# Patient Record
Sex: Female | Born: 1960 | Race: White | Hispanic: No | Marital: Married
Health system: Southern US, Community
[De-identification: ages and names within clinical notes are randomized; demographics above are authoritative.]

---

## 1998-04-16 ENCOUNTER — Other Ambulatory Visit: Admission: RE | Admit: 1998-04-16 | Discharge: 1998-04-16 | Payer: Self-pay | Admitting: Gynecology

## 1999-06-10 ENCOUNTER — Other Ambulatory Visit: Admission: RE | Admit: 1999-06-10 | Discharge: 1999-06-10 | Payer: Self-pay | Admitting: Gynecology

## 2000-08-03 ENCOUNTER — Other Ambulatory Visit: Admission: RE | Admit: 2000-08-03 | Discharge: 2000-08-03 | Payer: Self-pay | Admitting: Gynecology

## 2001-05-12 ENCOUNTER — Encounter: Admission: RE | Admit: 2001-05-12 | Discharge: 2001-05-12 | Payer: Self-pay | Admitting: Gynecology

## 2001-05-12 ENCOUNTER — Encounter: Payer: Self-pay | Admitting: Gynecology

## 2001-09-16 ENCOUNTER — Other Ambulatory Visit: Admission: RE | Admit: 2001-09-16 | Discharge: 2001-09-16 | Payer: Self-pay | Admitting: Gynecology

## 2002-06-09 ENCOUNTER — Encounter: Payer: Self-pay | Admitting: Gynecology

## 2002-06-09 ENCOUNTER — Encounter: Admission: RE | Admit: 2002-06-09 | Discharge: 2002-06-09 | Payer: Self-pay | Admitting: Gynecology

## 2002-08-16 ENCOUNTER — Other Ambulatory Visit: Admission: RE | Admit: 2002-08-16 | Discharge: 2002-08-16 | Payer: Self-pay | Admitting: Gynecology

## 2003-07-31 ENCOUNTER — Encounter: Admission: RE | Admit: 2003-07-31 | Discharge: 2003-07-31 | Payer: Self-pay | Admitting: Gynecology

## 2003-08-24 ENCOUNTER — Other Ambulatory Visit: Admission: RE | Admit: 2003-08-24 | Discharge: 2003-08-24 | Payer: Self-pay | Admitting: Gynecology

## 2004-09-26 ENCOUNTER — Encounter: Admission: RE | Admit: 2004-09-26 | Discharge: 2004-09-26 | Payer: Self-pay | Admitting: Gynecology

## 2004-09-26 ENCOUNTER — Other Ambulatory Visit: Admission: RE | Admit: 2004-09-26 | Discharge: 2004-09-26 | Payer: Self-pay | Admitting: Gynecology

## 2004-11-01 ENCOUNTER — Other Ambulatory Visit: Admission: RE | Admit: 2004-11-01 | Discharge: 2004-11-01 | Payer: Self-pay | Admitting: Gynecology

## 2005-03-11 ENCOUNTER — Other Ambulatory Visit: Admission: RE | Admit: 2005-03-11 | Discharge: 2005-03-11 | Payer: Self-pay | Admitting: Gynecology

## 2005-10-16 ENCOUNTER — Encounter: Admission: RE | Admit: 2005-10-16 | Discharge: 2005-10-16 | Payer: Self-pay | Admitting: Gynecology

## 2005-10-16 ENCOUNTER — Other Ambulatory Visit: Admission: RE | Admit: 2005-10-16 | Discharge: 2005-10-16 | Payer: Self-pay | Admitting: Gynecology

## 2006-05-08 ENCOUNTER — Other Ambulatory Visit: Admission: RE | Admit: 2006-05-08 | Discharge: 2006-05-08 | Payer: Self-pay | Admitting: Gynecology

## 2006-12-18 ENCOUNTER — Encounter: Admission: RE | Admit: 2006-12-18 | Discharge: 2006-12-18 | Payer: Self-pay | Admitting: Gynecology

## 2008-01-04 ENCOUNTER — Encounter: Admission: RE | Admit: 2008-01-04 | Discharge: 2008-01-04 | Payer: Self-pay | Admitting: Gynecology

## 2009-01-18 ENCOUNTER — Encounter: Admission: RE | Admit: 2009-01-18 | Discharge: 2009-01-18 | Payer: Self-pay | Admitting: Gynecology

## 2009-03-14 ENCOUNTER — Ambulatory Visit (HOSPITAL_BASED_OUTPATIENT_CLINIC_OR_DEPARTMENT_OTHER): Admission: RE | Admit: 2009-03-14 | Discharge: 2009-03-15 | Payer: Self-pay | Admitting: Gynecology

## 2010-03-07 ENCOUNTER — Encounter
Admission: RE | Admit: 2010-03-07 | Discharge: 2010-03-07 | Payer: Self-pay | Source: Home / Self Care | Admitting: Gynecology

## 2010-04-16 ENCOUNTER — Other Ambulatory Visit: Payer: Self-pay | Admitting: Gynecology

## 2010-07-02 LAB — POCT PREGNANCY, URINE: Preg Test, Ur: NEGATIVE

## 2010-07-02 LAB — POCT HEMOGLOBIN-HEMACUE: Hemoglobin: 15.1 g/dL — ABNORMAL HIGH (ref 12.0–15.0)

## 2011-03-03 ENCOUNTER — Other Ambulatory Visit: Payer: Self-pay | Admitting: Gynecology

## 2011-03-03 DIAGNOSIS — Z1231 Encounter for screening mammogram for malignant neoplasm of breast: Secondary | ICD-10-CM

## 2011-04-02 ENCOUNTER — Ambulatory Visit
Admission: RE | Admit: 2011-04-02 | Discharge: 2011-04-02 | Disposition: A | Discharge status: Home / Self Care | Payer: BC Managed Care – PPO | Source: Ambulatory Visit | Attending: Gynecology | Admitting: Gynecology

## 2011-04-02 DIAGNOSIS — Z1231 Encounter for screening mammogram for malignant neoplasm of breast: Secondary | ICD-10-CM

## 2011-06-06 ENCOUNTER — Other Ambulatory Visit: Payer: Self-pay | Admitting: Gynecology

## 2012-04-08 ENCOUNTER — Other Ambulatory Visit: Payer: Self-pay | Admitting: Gynecology

## 2012-04-08 DIAGNOSIS — Z1231 Encounter for screening mammogram for malignant neoplasm of breast: Secondary | ICD-10-CM

## 2012-06-07 ENCOUNTER — Other Ambulatory Visit: Payer: Self-pay | Admitting: Gynecology

## 2012-07-27 ENCOUNTER — Other Ambulatory Visit: Payer: Self-pay

## 2012-07-27 DIAGNOSIS — Z1231 Encounter for screening mammogram for malignant neoplasm of breast: Secondary | ICD-10-CM

## 2012-09-06 ENCOUNTER — Ambulatory Visit
Admission: RE | Admit: 2012-09-06 | Discharge: 2012-09-06 | Disposition: A | Discharge status: Home / Self Care | Payer: BC Managed Care – PPO | Source: Ambulatory Visit

## 2012-09-06 DIAGNOSIS — Z1231 Encounter for screening mammogram for malignant neoplasm of breast: Secondary | ICD-10-CM

## 2012-09-08 ENCOUNTER — Other Ambulatory Visit: Payer: Self-pay | Admitting: Gynecology

## 2012-09-08 DIAGNOSIS — R928 Other abnormal and inconclusive findings on diagnostic imaging of breast: Secondary | ICD-10-CM

## 2012-09-22 ENCOUNTER — Ambulatory Visit
Admission: RE | Admit: 2012-09-22 | Discharge: 2012-09-22 | Disposition: A | Discharge status: Home / Self Care | Payer: BC Managed Care – PPO | Source: Ambulatory Visit | Attending: Gynecology | Admitting: Gynecology

## 2012-09-22 DIAGNOSIS — R928 Other abnormal and inconclusive findings on diagnostic imaging of breast: Secondary | ICD-10-CM

## 2013-03-02 ENCOUNTER — Other Ambulatory Visit: Payer: Self-pay | Admitting: Gynecology

## 2013-03-02 DIAGNOSIS — N63 Unspecified lump in unspecified breast: Secondary | ICD-10-CM

## 2013-03-28 ENCOUNTER — Ambulatory Visit
Admission: RE | Admit: 2013-03-28 | Discharge: 2013-03-28 | Disposition: A | Discharge status: Home / Self Care | Payer: BC Managed Care – PPO | Source: Ambulatory Visit | Attending: Gynecology | Admitting: Gynecology

## 2013-03-28 DIAGNOSIS — N63 Unspecified lump in unspecified breast: Secondary | ICD-10-CM

## 2013-10-17 ENCOUNTER — Other Ambulatory Visit: Payer: Self-pay

## 2013-10-17 DIAGNOSIS — Z1231 Encounter for screening mammogram for malignant neoplasm of breast: Secondary | ICD-10-CM

## 2013-10-28 ENCOUNTER — Encounter (INDEPENDENT_AMBULATORY_CARE_PROVIDER_SITE_OTHER): Payer: Self-pay

## 2013-10-28 ENCOUNTER — Ambulatory Visit
Admission: RE | Admit: 2013-10-28 | Discharge: 2013-10-28 | Disposition: A | Discharge status: Home / Self Care | Payer: BC Managed Care – PPO | Source: Ambulatory Visit

## 2013-10-28 DIAGNOSIS — Z1231 Encounter for screening mammogram for malignant neoplasm of breast: Secondary | ICD-10-CM

## 2014-12-12 ENCOUNTER — Other Ambulatory Visit: Payer: Self-pay

## 2014-12-12 DIAGNOSIS — Z1231 Encounter for screening mammogram for malignant neoplasm of breast: Secondary | ICD-10-CM

## 2015-01-17 ENCOUNTER — Ambulatory Visit
Admission: RE | Admit: 2015-01-17 | Discharge: 2015-01-17 | Disposition: A | Discharge status: Home / Self Care | Payer: BC Managed Care – PPO | Source: Ambulatory Visit

## 2015-01-17 DIAGNOSIS — Z1231 Encounter for screening mammogram for malignant neoplasm of breast: Secondary | ICD-10-CM

## 2015-11-08 ENCOUNTER — Other Ambulatory Visit: Payer: Self-pay | Admitting: Family Medicine

## 2015-11-08 DIAGNOSIS — Z1231 Encounter for screening mammogram for malignant neoplasm of breast: Secondary | ICD-10-CM

## 2016-01-18 ENCOUNTER — Ambulatory Visit
Admission: RE | Admit: 2016-01-18 | Discharge: 2016-01-18 | Disposition: A | Discharge status: Home / Self Care | Payer: BC Managed Care – PPO | Source: Ambulatory Visit | Attending: Family Medicine | Admitting: Family Medicine

## 2016-01-18 DIAGNOSIS — Z1231 Encounter for screening mammogram for malignant neoplasm of breast: Secondary | ICD-10-CM

## 2017-02-26 ENCOUNTER — Other Ambulatory Visit: Payer: Self-pay | Admitting: Family Medicine

## 2017-02-26 DIAGNOSIS — Z1231 Encounter for screening mammogram for malignant neoplasm of breast: Secondary | ICD-10-CM

## 2017-04-07 ENCOUNTER — Ambulatory Visit
Admission: RE | Admit: 2017-04-07 | Discharge: 2017-04-07 | Disposition: A | Discharge status: Home / Self Care | Payer: BC Managed Care – PPO | Source: Ambulatory Visit | Attending: Family Medicine | Admitting: Family Medicine

## 2017-04-07 DIAGNOSIS — Z1231 Encounter for screening mammogram for malignant neoplasm of breast: Secondary | ICD-10-CM

## 2018-04-20 ENCOUNTER — Other Ambulatory Visit: Payer: Self-pay | Admitting: Family Medicine

## 2018-04-20 DIAGNOSIS — Z1231 Encounter for screening mammogram for malignant neoplasm of breast: Secondary | ICD-10-CM

## 2018-05-05 DIAGNOSIS — G8929 Other chronic pain: Secondary | ICD-10-CM | POA: Insufficient documentation

## 2018-05-17 ENCOUNTER — Ambulatory Visit: Payer: BC Managed Care – PPO

## 2018-05-17 ENCOUNTER — Ambulatory Visit
Admission: RE | Admit: 2018-05-17 | Discharge: 2018-05-17 | Disposition: A | Discharge status: Home / Self Care | Payer: BC Managed Care – PPO | Source: Ambulatory Visit | Attending: Family Medicine | Admitting: Family Medicine

## 2018-05-17 DIAGNOSIS — Z1231 Encounter for screening mammogram for malignant neoplasm of breast: Secondary | ICD-10-CM

## 2019-09-17 IMAGING — MG DIGITAL SCREENING BILATERAL MAMMOGRAM WITH TOMO AND CAD
8 series · 8 of 24 positions shown · non-contrast
Comparison: Previous exam(s).

CLINICAL DATA: Screening.

EXAM:
DIGITAL SCREENING BILATERAL MAMMOGRAM WITH TOMO AND CAD

[R CC synth-2D]
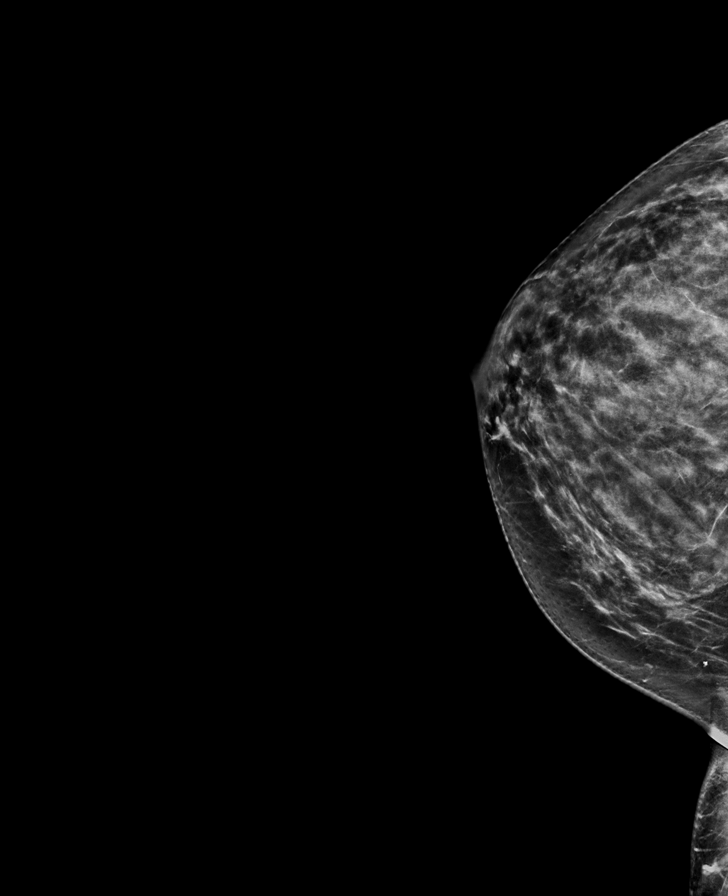

[L MLO synth-2D]
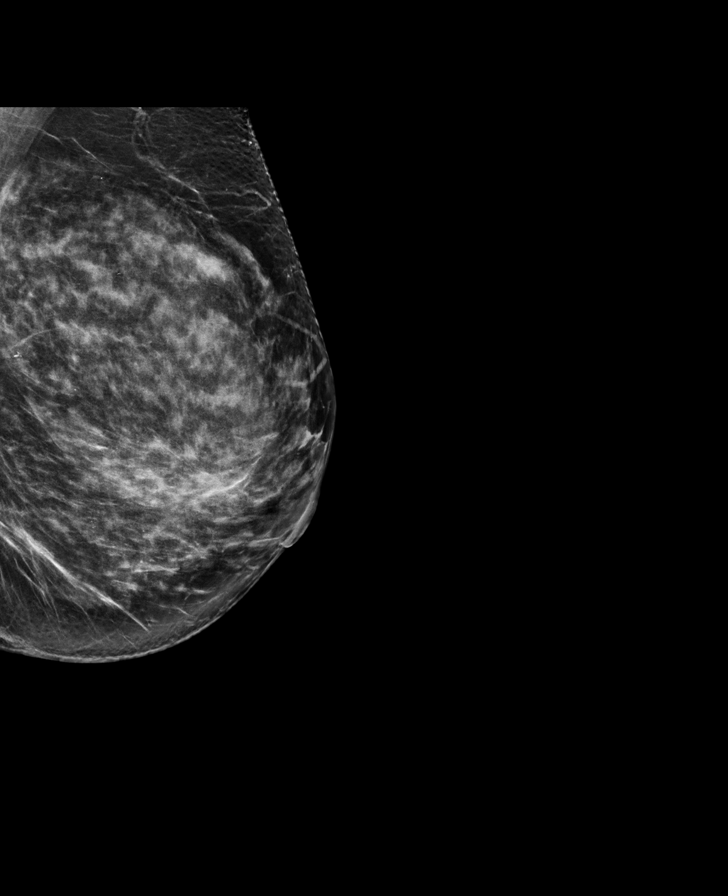

[R MLO synth-2D]
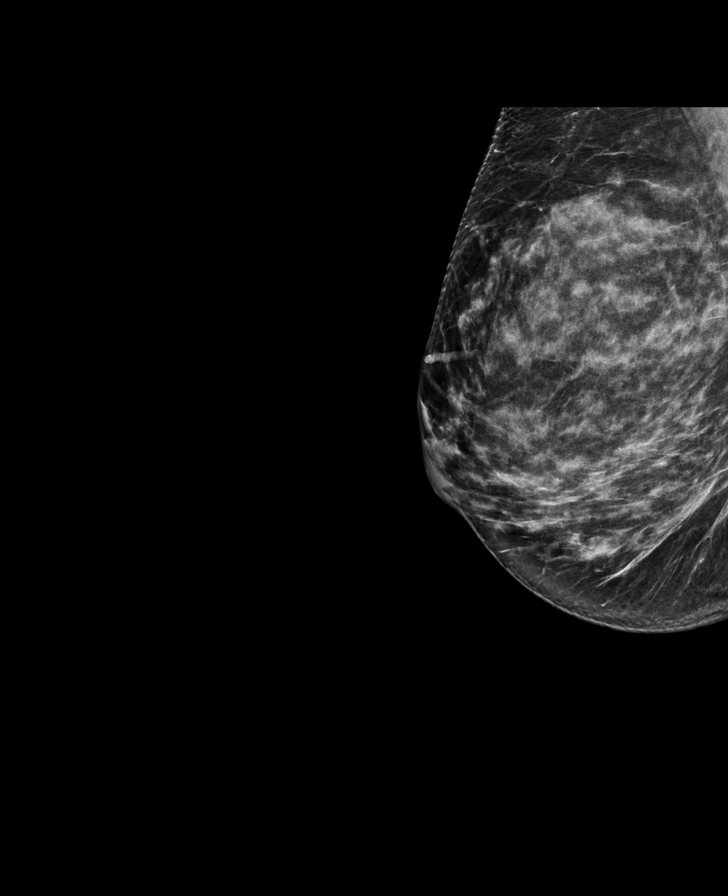

[L CC synth-2D]
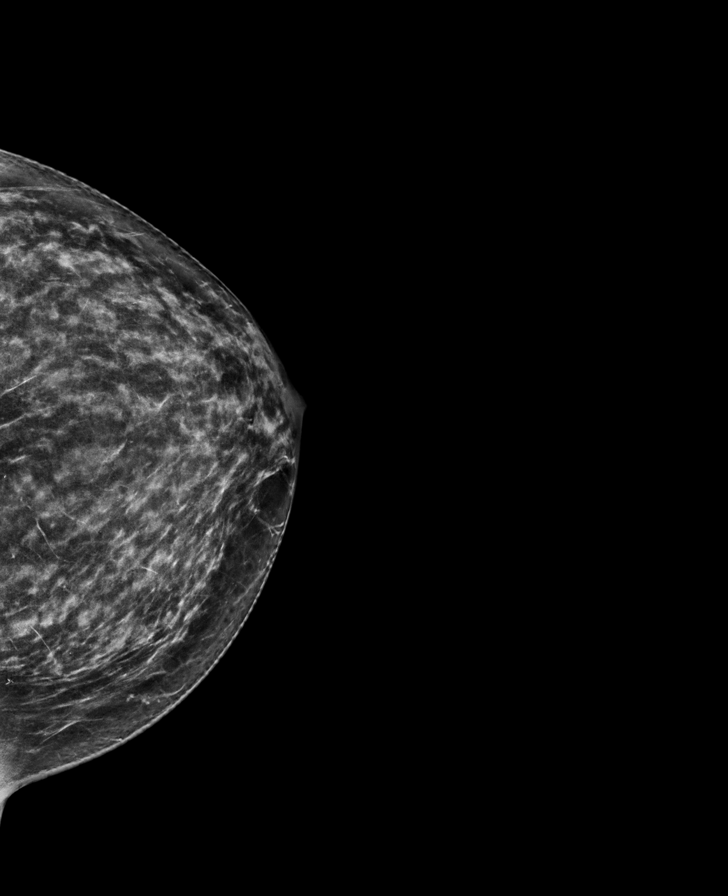

[R CC tomo · tomo slice 37/72.0]
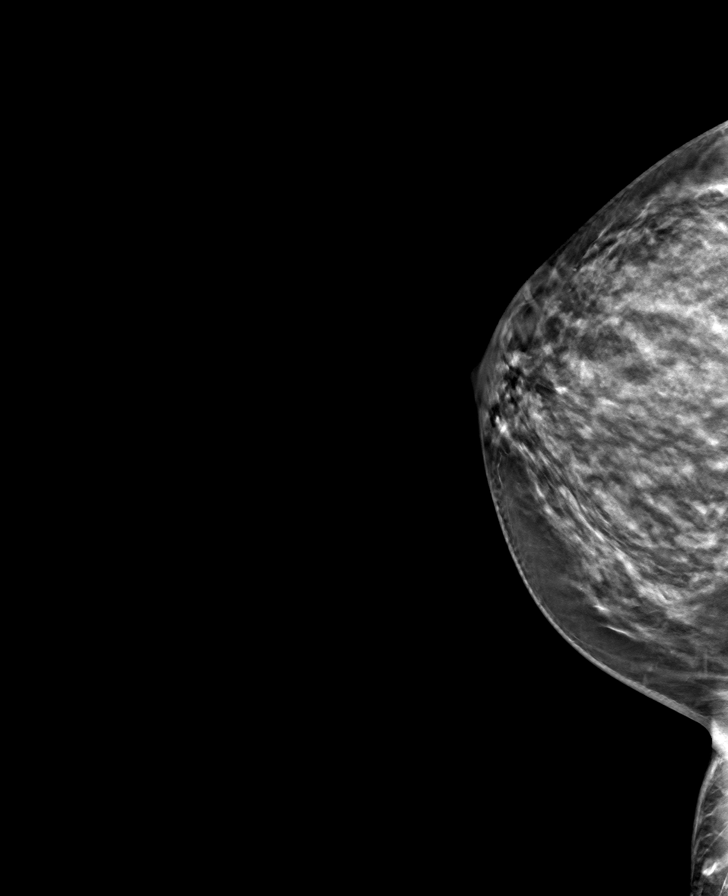

[L MLO tomo · tomo slice 39/76.0]
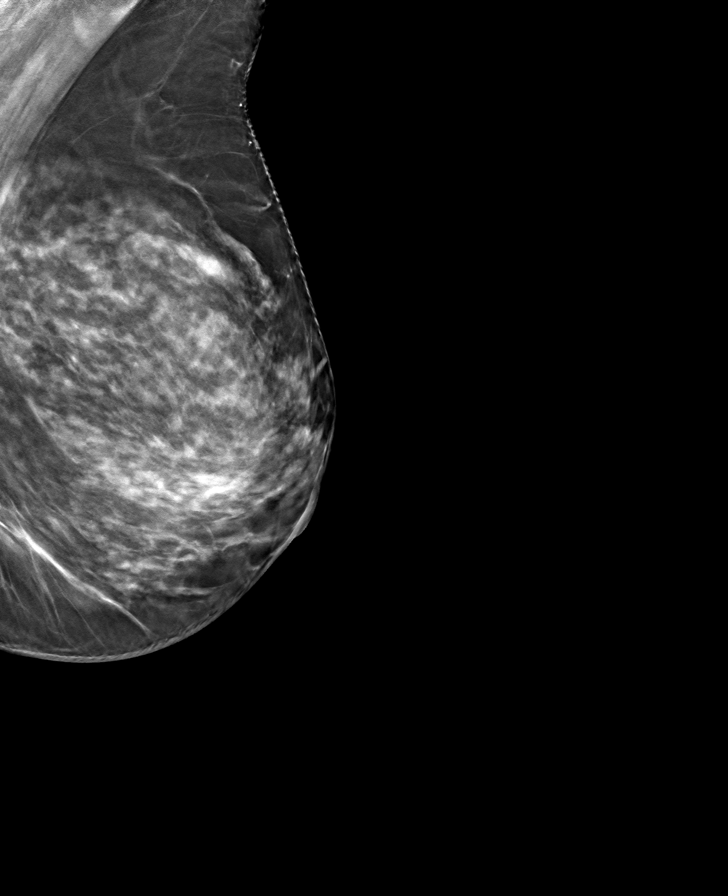

[L CC tomo · tomo slice 36/71.0]
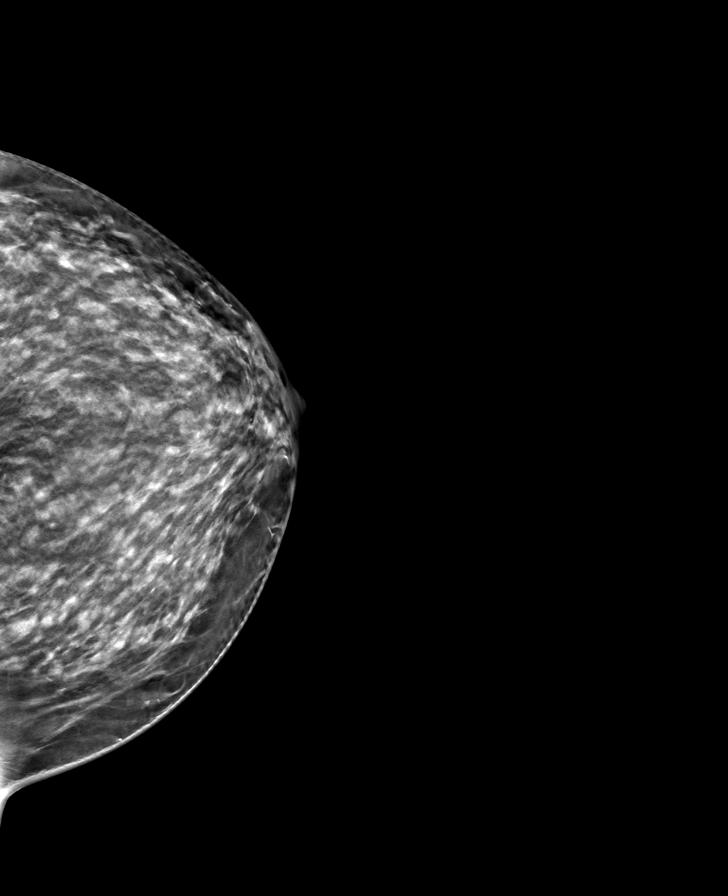

[R MLO tomo · tomo slice 38/75.0]
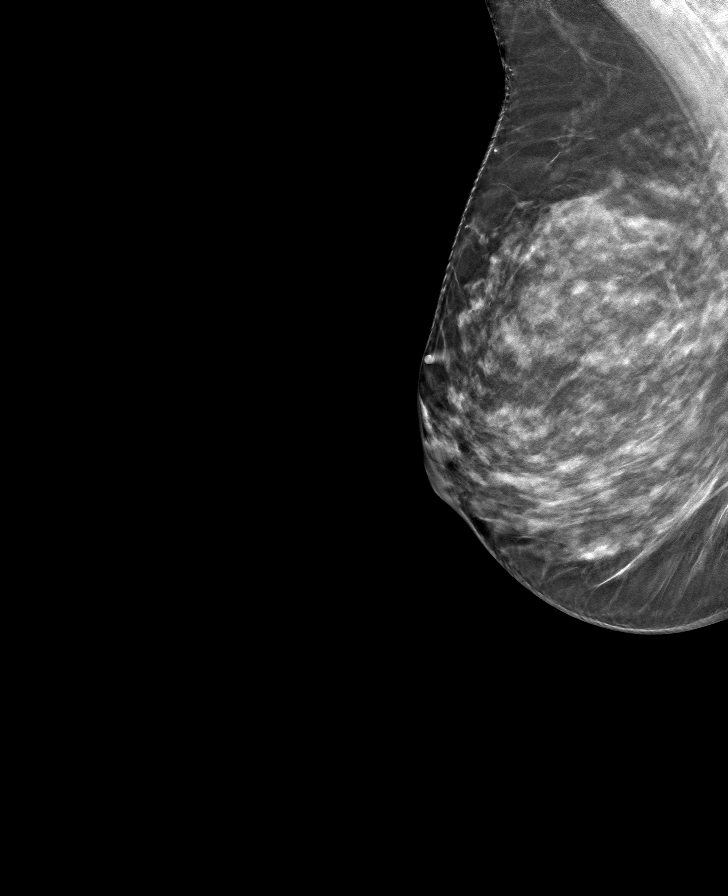

[8 of 24 positions shown; findings below may reference images not displayed]

ACR Breast Density Category c: The breast tissue is heterogeneously
dense, which may obscure small masses.
FINDINGS: There are no findings suspicious for malignancy. Images were
processed with CAD.
IMPRESSION: No mammographic evidence of malignancy. A result letter of this
screening mammogram will be mailed directly to the patient.

RECOMMENDATION:
Screening mammogram in one year. (Code:FT-U-LHB)

BI-RADS CATEGORY  1: Negative.

## 2020-10-10 ENCOUNTER — Encounter: Payer: Self-pay | Admitting: Sports Medicine

## 2020-10-10 ENCOUNTER — Ambulatory Visit: Payer: BC Managed Care – PPO | Admitting: Sports Medicine

## 2020-10-10 ENCOUNTER — Other Ambulatory Visit: Payer: Self-pay

## 2020-10-10 DIAGNOSIS — B351 Tinea unguium: Secondary | ICD-10-CM | POA: Diagnosis not present

## 2020-10-10 DIAGNOSIS — M79675 Pain in left toe(s): Secondary | ICD-10-CM | POA: Diagnosis not present

## 2020-10-10 DIAGNOSIS — M79674 Pain in right toe(s): Secondary | ICD-10-CM

## 2020-10-10 NOTE — Progress Notes (Signed)
Subjective: Stephanie Kent is a 60 y.o. female patient seen today in office with complaint of mildly painful thickened and discolored nails. Patient is desiring treatment for nail changes; has tried OTC topicals/Medication in the past with no improvement. Reports that nails are becoming difficult to manage because of the thickness and reports that she even lost her big toenail and part of the nail came off and admits to a history of multiple episodes of trauma to the right great toenail greater than the left great toenail of her always having her son stepping on her toes or dropping things on her toes. Patient has no other pedal complaints at this time.   Patient Active Problem List   Diagnosis Date Noted   Chronic low back pain 05/05/2018    Current Outpatient Medications on File Prior to Visit  Medication Sig Dispense Refill   estradiol (CLIMARA - DOSED IN MG/24 HR) 0.0375 mg/24hr patch estradiol 0.0375 mg/24 hr weekly transdermal patch     No current facility-administered medications on file prior to visit.    No Known Allergies  Objective: Physical Exam  General: Well developed, nourished, no acute distress, awake, alert and oriented x 3  Vascular: Dorsalis pedis artery 2/4 bilateral, Posterior tibial artery 2/4 bilateral, skin temperature warm to warm proximal to distal bilateral lower extremities, no varicosities, pedal hair present bilateral.  Neurological: Gross sensation present via light touch bilateral.   Dermatological: Skin is warm, dry, and supple bilateral, Nails 1-10 are short thick, and discolored with mild subungal debris, no webspace macerations present bilateral, no open lesions present bilateral, no callus/corns/hyperkeratotic tissue present bilateral. No signs of infection bilateral.  Musculoskeletal: No boney deformities noted bilateral. Muscular strength within normal limits without painon range of motion. No pain with calf compression bilateral.  Assessment  and Plan:  Problem List Items Addressed This Visit   None Visit Diagnoses     Dermatophytosis of nail    -  Primary   Relevant Orders   Fungus Culture with Smear   Toe pain, bilateral           -Examined patient -Discussed treatment options for painful dystrophic nails  -Fungal culture was obtained by removing a portion of the hard nail itself from each of the involved toenails 1 through 10 using a sterile nail nipper and sent to [facility] lab. Patient tolerated the biopsy procedure well without discomfort or need for anesthesia.  -Patient to return in 4-5 weeks for follow up evaluation and discussion of fungal culture results or sooner if symptoms worsen.  Asencion Islam, DPM

## 2020-11-14 ENCOUNTER — Ambulatory Visit: Payer: BC Managed Care – PPO | Admitting: Sports Medicine

## 2020-11-14 ENCOUNTER — Encounter: Payer: Self-pay | Admitting: Sports Medicine

## 2020-11-14 ENCOUNTER — Other Ambulatory Visit: Payer: Self-pay

## 2020-11-14 DIAGNOSIS — M79675 Pain in left toe(s): Secondary | ICD-10-CM | POA: Diagnosis not present

## 2020-11-14 DIAGNOSIS — B351 Tinea unguium: Secondary | ICD-10-CM | POA: Diagnosis not present

## 2020-11-14 DIAGNOSIS — M79674 Pain in right toe(s): Secondary | ICD-10-CM

## 2020-11-14 NOTE — Progress Notes (Signed)
Subjective: Stephanie Kent is a 60 y.o. female patient seen today in office for fungal culture results. Patient has no other pedal complaints at this time.   Patient Active Problem List   Diagnosis Date Noted   Chronic low back pain 05/05/2018    Current Outpatient Medications on File Prior to Visit  Medication Sig Dispense Refill   estradiol (CLIMARA - DOSED IN MG/24 HR) 0.0375 mg/24hr patch estradiol 0.0375 mg/24 hr weekly transdermal patch     No current facility-administered medications on file prior to visit.    No Known Allergies  Objective: Physical Exam  General: Well developed, nourished, no acute distress, awake, alert and oriented x 3  Vascular: Dorsalis pedis artery 2/4 bilateral, Posterior tibial artery 2/4 bilateral, skin temperature warm to warm proximal to distal bilateral lower extremities, no varicosities, pedal hair present bilateral.   Neurological: Gross sensation present via light touch bilateral.    Dermatological: Skin is warm, dry, and supple bilateral, Nails 1-10 are short thick, and discolored with mild subungal debris, no webspace macerations present bilateral, no open lesions present bilateral, no callus/corns/hyperkeratotic tissue present bilateral. No signs of infection bilateral.   Musculoskeletal: No boney deformities noted bilateral. Muscular strength within normal limits without painon range of motion. No pain with calf compression bilateral.  Fungal culture microtrauma and fungus dermatophyte/T rubrum  Assessment and Plan:  Problem List Items Addressed This Visit   None Visit Diagnoses     Dermatophytosis of nail    -  Primary   Toe pain, bilateral           -Examined patient -Discussed treatment options for painful mycotic nails with microtrauma -Patient to start laser treatment and then will be rechecked by me in 3 months advised patient that if her nails are not making much progress after at least 3 laser treatments we will also  add on oral Lamisil -Meanwhile advised good hygiene habits and to avoid shoes that could rub toes -Patient to return in 12 weeks for follow up evaluation or sooner if symptoms worsen.  Asencion Islam, DPM

## 2020-11-16 ENCOUNTER — Other Ambulatory Visit: Payer: Self-pay

## 2020-11-16 ENCOUNTER — Ambulatory Visit (INDEPENDENT_AMBULATORY_CARE_PROVIDER_SITE_OTHER): Payer: Self-pay | Admitting: *Deleted

## 2020-11-16 DIAGNOSIS — B351 Tinea unguium: Secondary | ICD-10-CM

## 2020-11-16 NOTE — Progress Notes (Signed)
Patient presents today for the 1st laser treatment. Diagnosed with mycotic nail infection by Dr. Marylene Land.   Toenail most affected all ten nails.  All other systems are negative.  Nails were filed thin. Laser therapy was administered to 1-5 toenails bilateral and patient tolerated the treatment well. All safety precautions were in place.    Follow up in 4 weeks for laser # 2.  Picture of nails taken today to document visual progress   ~Dr Marylene Land will be following up with patient mid November to see if Lamisil needs to be added in combination with laser.

## 2020-11-16 NOTE — Patient Instructions (Signed)

## 2020-12-17 ENCOUNTER — Ambulatory Visit (INDEPENDENT_AMBULATORY_CARE_PROVIDER_SITE_OTHER): Payer: BC Managed Care – PPO

## 2020-12-17 ENCOUNTER — Other Ambulatory Visit: Payer: Self-pay

## 2020-12-17 DIAGNOSIS — B351 Tinea unguium: Secondary | ICD-10-CM

## 2020-12-17 NOTE — Progress Notes (Signed)
Patient presents today for the 2nd laser treatment. Diagnosed with mycotic nail infection by Dr. Marylene Land.   Toenail most affected all ten nails.  All other systems are negative.  Nails were filed thin. Laser therapy was administered to 1-5 toenails bilateral and patient tolerated the treatment well. All safety precautions were in place.    Follow up in 4 weeks for laser # 3.     ~Dr Marylene Land will be following up with patient mid November to see if Lamisil needs to be added in combination with laser.

## 2021-01-14 ENCOUNTER — Ambulatory Visit (INDEPENDENT_AMBULATORY_CARE_PROVIDER_SITE_OTHER): Payer: BC Managed Care – PPO

## 2021-01-14 ENCOUNTER — Other Ambulatory Visit: Payer: Self-pay

## 2021-01-14 DIAGNOSIS — B351 Tinea unguium: Secondary | ICD-10-CM

## 2021-01-14 NOTE — Progress Notes (Signed)
Patient presents today for the 3rd laser treatment. Diagnosed with mycotic nail infection by Dr. Marylene Land.   Toenail most affected all ten nails.  All other systems are negative.  Nails were filed thin. Laser therapy was administered to 1-5 toenails bilateral and patient tolerated the treatment well. All safety precautions were in place.    Follow up in 6 weeks for laser # 4.     ~Dr Marylene Land will be following up with patient mid November to see if Lamisil needs to be added in combination with laser.

## 2021-02-15 ENCOUNTER — Ambulatory Visit: Payer: BC Managed Care – PPO | Admitting: Sports Medicine

## 2021-02-15 DIAGNOSIS — M79675 Pain in left toe(s): Secondary | ICD-10-CM | POA: Diagnosis not present

## 2021-02-15 DIAGNOSIS — B351 Tinea unguium: Secondary | ICD-10-CM | POA: Diagnosis not present

## 2021-02-15 DIAGNOSIS — M79674 Pain in right toe(s): Secondary | ICD-10-CM | POA: Diagnosis not present

## 2021-02-15 NOTE — Progress Notes (Signed)
Subjective: Stephanie Kent is a 59 y.o. female patient seen today in office for follow up evaluation after 3 laser treatments. Reports that she is doing good, noticing a difference.   Patient Active Problem List   Diagnosis Date Noted   Chronic low back pain 05/05/2018    Current Outpatient Medications on File Prior to Visit  Medication Sig Dispense Refill   estradiol (CLIMARA - DOSED IN MG/24 HR) 0.0375 mg/24hr patch estradiol 0.0375 mg/24 hr weekly transdermal patch     No current facility-administered medications on file prior to visit.    No Known Allergies  Objective: Physical Exam  General: Well developed, nourished, no acute distress, awake, alert and oriented x 3   Vascular: Dorsalis pedis artery 2/4 bilateral, Posterior tibial artery 2/4 bilateral, skin temperature warm to warm proximal to distal bilateral lower extremities, no varicosities, pedal hair present bilateral.   Neurological: Gross sensation present via light touch bilateral.    Dermatological: Skin is warm, dry, and supple bilateral, Proximal clearance noted on bilateral hallux nails, very minimal specks at left hallux nail of dry blood.   Musculoskeletal: No symptomatic boney deformities noted bilateral. Muscular strength within normal limits without painon range of motion. No pain with calf compression bilateral.   Assessment and Plan:  Problem List Items Addressed This Visit   None Visit Diagnoses     Dermatophytosis of nail    -  Primary   Toe pain, bilateral           -Examined patient -Re-Discussed treatment options for mycotic nails -Recommend patient to continue with laser -Treatment #4 as scheduled  -Discussed good hygiene habits -Return as scheduled   Asencion Islam, DPM

## 2021-02-25 ENCOUNTER — Ambulatory Visit (INDEPENDENT_AMBULATORY_CARE_PROVIDER_SITE_OTHER): Payer: BC Managed Care – PPO

## 2021-02-25 ENCOUNTER — Other Ambulatory Visit: Payer: Self-pay

## 2021-02-25 DIAGNOSIS — B351 Tinea unguium: Secondary | ICD-10-CM

## 2021-02-25 NOTE — Progress Notes (Signed)
Patient presents today for the 4th laser treatment. Diagnosed with mycotic nail infection by Dr. Stover.   Toenail most affected all ten nails.  All other systems are negative.  Nails were filed thin. Laser therapy was administered to 1-5 toenails bilateral and patient tolerated the treatment well. All safety precautions were in place.    Follow up in 6 weeks for laser # 5    

## 2021-03-01 ENCOUNTER — Other Ambulatory Visit: Payer: BC Managed Care – PPO

## 2021-04-12 ENCOUNTER — Ambulatory Visit (INDEPENDENT_AMBULATORY_CARE_PROVIDER_SITE_OTHER): Payer: Self-pay

## 2021-04-12 ENCOUNTER — Other Ambulatory Visit: Payer: Self-pay

## 2021-04-12 DIAGNOSIS — B351 Tinea unguium: Secondary | ICD-10-CM

## 2021-04-12 NOTE — Progress Notes (Signed)
Patient presents today for the 5th laser treatment. Diagnosed with mycotic nail infection by Dr. Marylene Land.   Toenail most affected all ten nails.  All other systems are negative.  Nails were filed thin. Laser therapy was administered to 1-5 toenails bilateral and patient tolerated the treatment well. All safety precautions were in place.    Follow up in 6 weeks for laser # 6.

## 2021-05-24 ENCOUNTER — Other Ambulatory Visit: Payer: Self-pay

## 2021-05-24 ENCOUNTER — Ambulatory Visit (INDEPENDENT_AMBULATORY_CARE_PROVIDER_SITE_OTHER): Payer: BC Managed Care – PPO

## 2021-05-24 DIAGNOSIS — B351 Tinea unguium: Secondary | ICD-10-CM

## 2021-05-24 NOTE — Progress Notes (Signed)
Patient presents today for the 6th laser treatment. Diagnosed with mycotic nail infection by Dr. Cannon Kettle.   Toenail most affected all ten nails.  All other systems are negative.  Nails were filed thin. Laser therapy was administered to 1-5 toenails bilateral and patient tolerated the treatment well. All safety precautions were in place.    Patient has completed the recommended laser treatments. He will follow up with Dr. Cannon Kettle in 3 months to evaluate progress.

## 2021-05-24 NOTE — Patient Instructions (Signed)

## 2021-08-27 ENCOUNTER — Encounter: Payer: Self-pay | Admitting: Sports Medicine

## 2021-08-27 ENCOUNTER — Ambulatory Visit: Payer: BC Managed Care – PPO | Admitting: Sports Medicine

## 2021-08-27 DIAGNOSIS — M79675 Pain in left toe(s): Secondary | ICD-10-CM | POA: Diagnosis not present

## 2021-08-27 DIAGNOSIS — B351 Tinea unguium: Secondary | ICD-10-CM

## 2021-08-27 DIAGNOSIS — M79674 Pain in right toe(s): Secondary | ICD-10-CM

## 2021-08-27 DIAGNOSIS — N951 Menopausal and female climacteric states: Secondary | ICD-10-CM | POA: Insufficient documentation

## 2021-08-27 NOTE — Progress Notes (Signed)
Subjective: Stephanie Kent is a 61 y.o. female patient seen today in office for follow up evaluation after 6 laser treatments. Reports that she is doing good, noticing a difference. Laser seems to help. Dealing with right knee issue; seeing ortho in [place].   Patient Active Problem List   Diagnosis Date Noted   Menopausal syndrome 08/27/2021   Chronic low back pain 05/05/2018    Current Outpatient Medications on File Prior to Visit  Medication Sig Dispense Refill   ALPRAZolam (XANAX) 0.5 MG tablet alprazolam 0.5 mg tablet     estradiol (CLIMARA - DOSED IN MG/24 HR) 0.0375 mg/24hr patch estradiol 0.0375 mg/24 hr weekly transdermal patch     predniSONE (DELTASONE) 10 MG tablet prednisone 10 mg tablet     silver sulfADIAZINE (SSD) 1 % cream SSD 1 % topical cream     No current facility-administered medications on file prior to visit.    No Known Allergies  Objective: Physical Exam  General: Well developed, nourished, no acute distress, awake, alert and oriented x 3   Vascular: Dorsalis pedis artery 2/4 bilateral, Posterior tibial artery 2/4 bilateral, skin temperature warm to warm proximal to distal bilateral lower extremities, no varicosities, pedal hair present bilateral.   Neurological: Gross sensation present via light touch bilateral.    Dermatological: Skin is warm, dry, and supple bilateral, Proximal clearance noted on bilateral hallux nails, scrape on left 5th toe from patient hitting it this AM.    Musculoskeletal: No symptomatic boney deformities noted bilateral. Muscular strength within normal limits without painon range of motion. No pain with calf compression bilateral.   Assessment and Plan:  Problem List Items Addressed This Visit   None Visit Diagnoses     Dermatophytosis of nail    -  Primary   Toe pain, bilateral           -Examined patient -Re-Discussed treatment options for mycotic nails -Recommend patient to continue to monitor nails as they  grow out; may use tea tree oil or vicks vapor rub PRN -Discussed good hygiene habits -Applied bandaid to left 5th toe and advised patient to monitor if worsens to return to office -Return PRN  Landis Martins, DPM

## 2023-10-27 ENCOUNTER — Other Ambulatory Visit (HOSPITAL_BASED_OUTPATIENT_CLINIC_OR_DEPARTMENT_OTHER): Payer: Self-pay | Admitting: Gynecology

## 2023-10-27 DIAGNOSIS — Z13820 Encounter for screening for osteoporosis: Secondary | ICD-10-CM

## 2023-12-18 ENCOUNTER — Telehealth (HOSPITAL_BASED_OUTPATIENT_CLINIC_OR_DEPARTMENT_OTHER): Payer: Self-pay
